# Patient Record
Sex: Female | Born: 1970
Health system: Southern US, Community
[De-identification: ages and names within clinical notes are randomized; demographics above are authoritative.]

## PROBLEM LIST (undated history)

## (undated) DIAGNOSIS — T7840XA Allergy, unspecified, initial encounter: Secondary | ICD-10-CM

## (undated) HISTORY — DX: Allergy, unspecified, initial encounter: T78.40XA

---

## 2006-04-01 DIAGNOSIS — T7840XA Allergy, unspecified, initial encounter: Secondary | ICD-10-CM

## 2006-04-01 HISTORY — DX: Allergy, unspecified, initial encounter: T78.40XA

## 2015-05-22 ENCOUNTER — Encounter: Payer: Self-pay | Admitting: Family Medicine

## 2015-06-02 ENCOUNTER — Encounter: Payer: Self-pay | Admitting: Family Medicine

## 2015-06-02 ENCOUNTER — Ambulatory Visit (INDEPENDENT_AMBULATORY_CARE_PROVIDER_SITE_OTHER): Payer: 59 | Admitting: Family Medicine

## 2015-06-02 VITALS — BP 104/62 | HR 58 | Temp 98.5°F | Resp 12 | Ht 63.0 in | Wt 140.0 lb

## 2015-06-02 DIAGNOSIS — Z1239 Encounter for other screening for malignant neoplasm of breast: Secondary | ICD-10-CM | POA: Diagnosis not present

## 2015-06-02 DIAGNOSIS — B351 Tinea unguium: Secondary | ICD-10-CM | POA: Diagnosis not present

## 2015-06-02 DIAGNOSIS — Z0000 Encounter for general adult medical examination without abnormal findings: Secondary | ICD-10-CM

## 2015-06-02 DIAGNOSIS — Z803 Family history of malignant neoplasm of breast: Secondary | ICD-10-CM

## 2015-06-02 DIAGNOSIS — Z124 Encounter for screening for malignant neoplasm of cervix: Secondary | ICD-10-CM

## 2015-06-02 DIAGNOSIS — J302 Other seasonal allergic rhinitis: Secondary | ICD-10-CM | POA: Diagnosis not present

## 2015-06-02 LAB — COMPREHENSIVE METABOLIC PANEL
ALT: 10 U/L (ref 6–29)
AST: 16 U/L (ref 10–30)
Albumin: 4 g/dL (ref 3.6–5.1)
Alkaline Phosphatase: 31 U/L — ABNORMAL LOW (ref 33–115)
BUN: 13 mg/dL (ref 7–25)
CHLORIDE: 104 mmol/L (ref 98–110)
CO2: 25 mmol/L (ref 20–31)
CREATININE: 0.65 mg/dL (ref 0.50–1.10)
Calcium: 8.7 mg/dL (ref 8.6–10.2)
GLUCOSE: 89 mg/dL (ref 70–99)
POTASSIUM: 3.8 mmol/L (ref 3.5–5.3)
SODIUM: 138 mmol/L (ref 135–146)
Total Bilirubin: 0.8 mg/dL (ref 0.2–1.2)
Total Protein: 6.8 g/dL (ref 6.1–8.1)

## 2015-06-02 LAB — CBC WITH DIFFERENTIAL/PLATELET
Basophils Absolute: 0 10*3/uL (ref 0.0–0.1)
Basophils Relative: 0 % (ref 0–1)
EOS ABS: 0.1 10*3/uL (ref 0.0–0.7)
EOS PCT: 1 % (ref 0–5)
HCT: 38.1 % (ref 36.0–46.0)
Hemoglobin: 12.6 g/dL (ref 12.0–15.0)
LYMPHS ABS: 2.1 10*3/uL (ref 0.7–4.0)
Lymphocytes Relative: 35 % (ref 12–46)
MCH: 32 pg (ref 26.0–34.0)
MCHC: 33.1 g/dL (ref 30.0–36.0)
MCV: 96.7 fL (ref 78.0–100.0)
MONOS PCT: 6 % (ref 3–12)
MPV: 10.2 fL (ref 8.6–12.4)
Monocytes Absolute: 0.4 10*3/uL (ref 0.1–1.0)
Neutro Abs: 3.5 10*3/uL (ref 1.7–7.7)
Neutrophils Relative %: 58 % (ref 43–77)
PLATELETS: 240 10*3/uL (ref 150–400)
RBC: 3.94 MIL/uL (ref 3.87–5.11)
RDW: 12.9 % (ref 11.5–15.5)
WBC: 6.1 10*3/uL (ref 4.0–10.5)

## 2015-06-02 LAB — LIPID PANEL
CHOL/HDL RATIO: 2.8 ratio (ref <=5.0)
Cholesterol: 141 mg/dL (ref 125–200)
HDL: 51 mg/dL (ref >=46)
LDL Cholesterol: 81 mg/dL (ref <130)
Triglycerides: 45 mg/dL (ref <150)
VLDL: 9 mg/dL (ref <30)

## 2015-06-02 LAB — TSH: TSH: 1.6 m[IU]/L

## 2015-06-02 MED ORDER — LEVONORGESTREL 20 MCG/24HR IU IUD: 1.0000 | INTRAUTERINE_SYSTEM | Freq: Once | INTRAUTERINE | Status: DC

## 2015-06-02 MED ORDER — EFINACONAZOLE 10 % EX SOLN: CUTANEOUS | Status: DC

## 2015-06-02 NOTE — Patient Instructions (Addendum)
Release of records- OB/GYN  [place] [phone] Schedule your mammogram We will call with lab results Try zyrtec or claritin Jublia for your nails I recommend eye visit once a year I recommend dental visit every 6 months Goal is to  Exercise 30 minutes 5 days a week F/U as needed or in 1 year

## 2015-06-02 NOTE — Progress Notes (Signed)
Patient ID: Kim Steele, female   DOB: February 24, 1971, 45 y.o.   MRN: [id number]    Subjective:    Patient ID: Kim Steele, female    DOB: February 24, 1971, 45 y.o.   MRN: [id number]  Patient presents for New Patient CPE  Pt here to establish care. No previous PCP, [place] in [place]. She moved here in November due to her husbands job. History of abnormal PAP in the past, also had HPV but that has cleared. Due for mammogram, her mother had breast cancer and MGM.  No long term medications but she has noticed allergies since she moved here in November. Also concerned about 20lb weight gain over past 4 months, with no change in her diet or her activity level. Denies any fatigue or other illness.   Nail fungus for months, does not want oral medications   History reviewed Has 3 children- 3.5, 14, 16 , she home schools      Review Of Systems:  GEN- denies fatigue, fever, weight loss,weakness, recent illness HEENT- denies eye drainage, change in vision, nasal discharge, CVS- denies chest pain, palpitations RESP- denies SOB, cough, wheeze ABD- denies N/V, change in stools, abd pain GU- denies dysuria, hematuria, dribbling, incontinence MSK- denies joint pain, muscle aches, injury Neuro- denies headache, dizziness, syncope, seizure activity       Objective:    BP 104/62 mmHg  Pulse 58  Temp(Src) 98.5 F (36.9 C) (Oral)  Resp 12  Ht 5\' 3"  (1.6 m)  Wt 140 lb (63.504 kg)  BMI 24.81 kg/m2  LMP 06/01/2015 (Approximate) GEN- NAD, alert and oriented x3 HEENT- PERRL, EOMI, non injected sclera, pink conjunctiva, MMM, oropharynx clear Neck- Supple, no thyromegaly CVS- RRR, no murmur Breast- normal symmetry, no nipple inversion,no nipple drainage, no nodules or lumps felt Nodes- no axillary nodes RESP-CTAB ABD-NABS,soft,NT,ND GU- normal external genitalia, vaginal mucosa pink and moist, cervix visualized no growth, no blood form os, minimal thin clear discharge, no CMT, no ovarian masses,  uterus normal size EXT- No edema Pulses- Radial, DP- 2+        Assessment & Plan:      Problem List Items Addressed This Visit    None    Visit Diagnoses    Routine general medical examination at a health care facility    -  Primary    CPE done, Fasting labs, immunizations UTD, DECLINES flu shot, PAP Smear today, set up for mammogram    Relevant Orders    CBC with Differential/Platelet    Comprehensive metabolic panel    Lipid panel    TSH    Cervical cancer screening        Relevant Orders    PAP, ThinPrep ASCUS Rflx HPV Rflx Type    Breast cancer screening        Relevant Orders    MM DIGITAL SCREENING BILATERAL    Family history of breast cancer in first degree relative        Relevant Orders    MM DIGITAL SCREENING BILATERAL    Onychomycosis        Trial of Jublia topical to nails     Relevant Medications    Efinaconazole (JUBLIA) 10 % SOLN    Seasonal allergies        Trial of OTC anti-histamine, D/C Sudafed so no rebound congestion       Note: This dictation was prepared with Dragon dictation along with smaller phrase technology. Any transcriptional errors that result from this  process are unintentional.

## 2015-06-05 LAB — PAP THINPREP ASCUS RFLX HPV RFLX TYPE

## 2015-06-23 ENCOUNTER — Ambulatory Visit
Admission: RE | Admit: 2015-06-23 | Discharge: 2015-06-23 | Disposition: A | Discharge status: Home / Self Care | Payer: 59 | Source: Ambulatory Visit | Attending: Family Medicine | Admitting: Family Medicine

## 2015-06-23 DIAGNOSIS — Z1239 Encounter for other screening for malignant neoplasm of breast: Secondary | ICD-10-CM

## 2015-06-23 DIAGNOSIS — Z803 Family history of malignant neoplasm of breast: Secondary | ICD-10-CM

## 2015-09-04 ENCOUNTER — Encounter: Payer: Self-pay | Admitting: Family Medicine

## 2015-09-04 ENCOUNTER — Ambulatory Visit (INDEPENDENT_AMBULATORY_CARE_PROVIDER_SITE_OTHER): Payer: 59 | Admitting: Family Medicine

## 2015-09-04 VITALS — BP 116/60 | HR 64 | Temp 98.2°F | Resp 14 | Ht 63.0 in | Wt 138.0 lb

## 2015-09-04 DIAGNOSIS — M2550 Pain in unspecified joint: Secondary | ICD-10-CM

## 2015-09-04 LAB — RHEUMATOID FACTOR: Rhuematoid fact SerPl-aCnc: <10 IU/mL (ref <=14)

## 2015-09-04 LAB — C-REACTIVE PROTEIN

## 2015-09-04 NOTE — Progress Notes (Signed)
Patient ID: Kim Steele, female   DOB: 1971-03-26, 45 y.o.   MRN: [id number]    Subjective:    Patient ID: Kim Steele, female    DOB: 1971-03-26, 45 y.o.   MRN: [id number]  Patient presents for Joint/ Bone Pain Patient here with joint pain. The past 4-6 weeks she's had fleeting joint pain initially started in her shoulders then she had to sit in her elbow a couple weeks ago her right knee started hurting when he felt like his on give out when she was walking she's had some ankle pain as well. She's never had any joint pains before. She does remember having a tick bite but this is back in November she actually describes a bull's-eye rash but never received any treatment for this. She would like to have Lyme antibodies done. She denies any fever no rash no chest pain shortness of breath or GI symptoms. She has been taking Tylenol ibuprofen. She is still able to exercise she is up to running 2-1/2 miles 3 times a week and walks the other days. No family history of any rheumatoid arthritis besides a great grandmother    Review Of Systems:  GEN- denies fatigue, fever, weight loss,weakness, recent illness HEENT- denies eye drainage, change in vision, nasal discharge, CVS- denies chest pain, palpitations RESP- denies SOB, cough, wheeze ABD- denies N/V, change in stools, abd pain GU- denies dysuria, hematuria, dribbling, incontinence MSK- + joint pain, muscle aches, injury Neuro- denies headache, dizziness, syncope, seizure activity       Objective:    BP 116/60 mmHg  Pulse 64  Temp(Src) 98.2 F (36.8 C) (Oral)  Resp 14  Ht 5\' 3"  (1.6 m)  Wt 138 lb (62.596 kg)  BMI 24.45 kg/m2 GEN- NAD, alert and oriented x3 HEENT- PERRL, EOMI, non injected sclera, pink conjunctiva, MMM, oropharynx clear Neck- Supple, FROM  CVS- RRR, no murmur RESP-CTAB MSK- Spine NT, neg SLR, FROM spine, FROM bilat knees, HIP, mild pain with IR left hip, FROM ankles, mild TTP Left medial epicondyle against resistance,  muscles UE and LE non tender, rotator cuff in tact, biceps in tact  EXT- No edema Pulses- Radial, DP- 2+        Assessment & Plan:      Problem List Items Addressed This Visit    None    Visit Diagnoses    Joint pain    -  Primary    Polyrheumatica syndrome going on, would be very latent Lymes if titers positive. Check other inflammatory markers,. discussed prednisone she prefers to hold off    Relevant Orders    Sedimentation rate    C-reactive protein    ANA    B. burgdorfi antibodies    Rheumatoid factor       Note: This dictation was prepared with Dragon dictation along with smaller phrase technology. Any transcriptional errors that result from this process are unintentional.

## 2015-09-04 NOTE — Patient Instructions (Signed)
We will call with results F/U pending results  

## 2015-09-05 LAB — ANA: Anti Nuclear Antibody(ANA): NEGATIVE

## 2015-09-05 LAB — SEDIMENTATION RATE: Sed Rate: 0 mm/hr (ref 0–20)

## 2015-09-05 LAB — LYME AB/WESTERN BLOT REFLEX: B burgdorferi Ab IgG+IgM: <0.9 Index (ref <0.90)

## 2017-01-07 ENCOUNTER — Encounter: Payer: Self-pay | Admitting: Family Medicine

## 2017-01-07 ENCOUNTER — Ambulatory Visit (INDEPENDENT_AMBULATORY_CARE_PROVIDER_SITE_OTHER): Payer: BLUE CROSS/BLUE SHIELD | Admitting: Family Medicine

## 2017-01-07 VITALS — BP 100/58 | HR 76 | Temp 98.9°F | Resp 12 | Ht 63.0 in | Wt 137.0 lb

## 2017-01-07 DIAGNOSIS — Z803 Family history of malignant neoplasm of breast: Secondary | ICD-10-CM

## 2017-01-07 DIAGNOSIS — Z0000 Encounter for general adult medical examination without abnormal findings: Secondary | ICD-10-CM

## 2017-01-07 DIAGNOSIS — Z1239 Encounter for other screening for malignant neoplasm of breast: Secondary | ICD-10-CM

## 2017-01-07 DIAGNOSIS — Z1231 Encounter for screening mammogram for malignant neoplasm of breast: Secondary | ICD-10-CM | POA: Diagnosis not present

## 2017-01-07 NOTE — Progress Notes (Signed)
   Subjective:    Patient ID: Kim Steele, female    DOB: 11-Sep-1970, 46 y.o.   MRN: [id number]  Patient presents for Annual Exam (Pt fasting) Issue here for complete physical exam she has no particular concerns today. Therapy see with her 3 children states that she does get tired and fatigued during the day but is sleeping well eating well and trying to do some exercise release a few days a week. She still taking her supplements. She did remove her Mirena IUD she thought maybe this is contributing to her fatigue. There was no change when she took it out. Her husband and she decided that they would just remain off of any birth control    Review Of Systems:  GEN- denies fatigue, fever, weight loss,weakness, recent illness HEENT- denies eye drainage, change in vision, nasal discharge, CVS- denies chest pain, palpitations RESP- denies SOB, cough, wheeze ABD- denies N/V, change in stools, abd pain GU- denies dysuria, hematuria, dribbling, incontinence MSK- denies joint pain, muscle aches, injury Neuro- denies headache, dizziness, syncope, seizure activity       Objective:    BP (!) 100/58   Pulse 76   Temp 98.9 F (37.2 C) (Oral)   Resp 12   Ht  (1.6 m)   Wt 137 lb (62.1 kg)   SpO2 98%   BMI 24.27 kg/m  GEN- NAD, alert and oriented x3 HEENT- PERRL, EOMI, non injected sclera, pink conjunctiva, MMM, oropharynx clear, TM clear bilat no effusion Neck- Supple, no thyromegaly CVS- RRR, no murmur RESP-CTAB ABD-NABS,soft,NT,ND EXT- No edema Pulses- Radial, DP- 2+        Assessment & Plan:      Problem List Items Addressed This Visit    None    Visit Diagnoses    Routine general medical examination at a health care facility    -  Primary   CPE done, pt to schedule mammogram, fasting labs, declines flu shot   Relevant Orders   CBC with Differential/Platelet   Comprehensive metabolic panel   Lipid panel   Vitamin D, 25-hydroxy   TSH   Breast cancer screening       1st degree relative needs screening yearly   Relevant Orders   MM SCREENING BREAST TOMO BILATERAL   Family history of breast cancer       Relevant Orders   MM SCREENING BREAST TOMO BILATERAL      Note: This dictation was prepared with Dragon dictation along with smaller phrase technology. Any transcriptional errors that result from this process are unintentional.

## 2017-01-07 NOTE — Patient Instructions (Signed)
Schedule mammogram I recommend eye visit once a year I recommend dental visit every 6 months Goal is to  Exercise 30 minutes 5 days a week We will send a letter with lab results  F/U 1 year for physical

## 2017-01-08 LAB — COMPREHENSIVE METABOLIC PANEL WITH GFR
AG Ratio: 1.2 (calc) (ref 1.0–2.5)
ALT: 7 U/L (ref 6–29)
AST: 14 U/L (ref 10–35)
Albumin: 4 g/dL (ref 3.6–5.1)
Alkaline phosphatase (APISO): 34 U/L (ref 33–115)
BUN: 14 mg/dL (ref 7–25)
CO2: 24 mmol/L (ref 20–32)
Calcium: 9.2 mg/dL (ref 8.6–10.2)
Chloride: 105 mmol/L (ref 98–110)
Creat: 0.67 mg/dL (ref 0.50–1.10)
Globulin: 3.3 g/dL (ref 1.9–3.7)
Glucose, Bld: 84 mg/dL (ref 65–99)
Potassium: 4 mmol/L (ref 3.5–5.3)
Sodium: 137 mmol/L (ref 135–146)
Total Bilirubin: 0.8 mg/dL (ref 0.2–1.2)
Total Protein: 7.3 g/dL (ref 6.1–8.1)

## 2017-01-08 LAB — CBC WITH DIFFERENTIAL/PLATELET
Basophils Absolute: 37 cells/uL (ref 0–200)
Basophils Relative: 0.5 %
EOS ABS: 110 {cells}/uL (ref 15–500)
Eosinophils Relative: 1.5 %
HCT: 39.1 % (ref 35.0–45.0)
Hemoglobin: 13.2 g/dL (ref 11.7–15.5)
Lymphs Abs: 2197 cells/uL (ref 850–3900)
MCH: 32 pg (ref 27.0–33.0)
MCHC: 33.8 g/dL (ref 32.0–36.0)
MCV: 94.7 fL (ref 80.0–100.0)
MONOS PCT: 5.6 %
MPV: 10.7 fL (ref 7.5–12.5)
NEUTROS PCT: 62.3 %
Neutro Abs: 4548 cells/uL (ref 1500–7800)
PLATELETS: 248 10*3/uL (ref 140–400)
RBC: 4.13 10*6/uL (ref 3.80–5.10)
RDW: 11.7 % (ref 11.0–15.0)
TOTAL LYMPHOCYTE: 30.1 %
WBC: 7.3 10*3/uL (ref 3.8–10.8)
WBCMIX: 409 {cells}/uL (ref 200–950)

## 2017-01-08 LAB — VITAMIN D 25 HYDROXY (VIT D DEFICIENCY, FRACTURES): Vit D, 25-Hydroxy: 30 ng/mL (ref 30–100)

## 2017-01-08 LAB — LIPID PANEL
Cholesterol: 162 mg/dL
HDL: 52 mg/dL
LDL Cholesterol (Calc): 96 mg/dL
Non-HDL Cholesterol (Calc): 110 mg/dL
Total CHOL/HDL Ratio: 3.1 (calc)
Triglycerides: 55 mg/dL

## 2017-01-08 LAB — TSH: TSH: 1.34 mIU/L

## 2017-01-09 ENCOUNTER — Encounter: Payer: Self-pay | Admitting: Family Medicine

## 2017-01-24 ENCOUNTER — Ambulatory Visit
Admission: RE | Admit: 2017-01-24 | Discharge: 2017-01-24 | Disposition: A | Discharge status: Home / Self Care | Payer: BLUE CROSS/BLUE SHIELD | Source: Ambulatory Visit | Attending: Family Medicine | Admitting: Family Medicine

## 2017-01-24 DIAGNOSIS — Z803 Family history of malignant neoplasm of breast: Secondary | ICD-10-CM

## 2017-01-24 DIAGNOSIS — Z1231 Encounter for screening mammogram for malignant neoplasm of breast: Secondary | ICD-10-CM | POA: Diagnosis not present

## 2017-01-24 DIAGNOSIS — Z1239 Encounter for other screening for malignant neoplasm of breast: Secondary | ICD-10-CM

## 2017-07-10 ENCOUNTER — Other Ambulatory Visit: Payer: Self-pay

## 2017-07-10 ENCOUNTER — Ambulatory Visit (INDEPENDENT_AMBULATORY_CARE_PROVIDER_SITE_OTHER): Payer: BLUE CROSS/BLUE SHIELD | Admitting: Physician Assistant

## 2017-07-10 ENCOUNTER — Encounter: Payer: Self-pay | Admitting: Physician Assistant

## 2017-07-10 VITALS — BP 98/60 | HR 68 | Temp 98.2°F | Resp 16 | Ht 62.75 in | Wt 143.4 lb

## 2017-07-10 DIAGNOSIS — H6122 Impacted cerumen, left ear: Secondary | ICD-10-CM

## 2017-07-10 NOTE — Progress Notes (Signed)
    Patient ID: Kim Steele MRN: [id number], DOB: 10-14-1970, 47 y.o. Date of Encounter: 07/10/2017, 4:28 PM    Chief Complaint:  Chief Complaint  Patient presents with  . left ear pain    symptoms since 05/09/2017     HPI: 47 y.o. year old female presents with above.  She reports [facility] [place]  Water got stuck in her left ear for a whole day there.  It seemed to come out while she was sleeping.  Says that off and on ever since then-- her left ear has hurt occasionally and she has felt some discomfort there off and on ever since.  No other concerns to address.   Home Meds:   Outpatient Medications Prior to Visit  Medication Sig Dispense Refill  . B Complex-C (SUPER B COMPLEX PO) Take 1 capsule by mouth daily.    . Calcium Carbonate-Vitamin D (CALCIUM 600+D) 600-400 MG-UNIT tablet Take 1 tablet by mouth daily.    . vitamin C (ASCORBIC ACID) 500 MG tablet Take 1,000 mg by mouth daily.    . Cholecalciferol (VITAMIN D3) 1000 units CAPS Take 1 capsule by mouth daily.    . Multiple Vitamin (MULTIVITAMIN) tablet Take 1 tablet by mouth daily.     No facility-administered medications prior to visit.     Allergies: No Known Allergies    Review of Systems: See HPI for pertinent ROS. All other ROS negative.    Physical Exam: Blood pressure 98/60, pulse 68, temperature 98.2 F (36.8 C), temperature source Oral, resp. rate 16, height 5' 2.75" (1.594 m), weight 65 kg (143 lb 6.4 oz), last menstrual period 07/09/2017, SpO2 98 %., Body mass index is 25.6 kg/m. General:  WNWD WF. Appears in no acute distress. HEENT: Normocephalic, atraumatic, eyes without discharge, sclera non-icteric, nares are without discharge.  Right ear canal appears normal.  Right tympanic membrane appears normal. Left ear canal is completely occluded with cerumen. Neck: Supple. No thyromegaly. No lymphadenopathy. Lungs: Clear bilaterally to auscultation without wheezes, rales, or  rhonchi. Breathing is unlabored. Heart: Regular rhythm. No murmurs, rubs, or gallops. Msk:  Strength and tone normal for age. Extremities/Skin: Warm and dry. Neuro: Alert and oriented X 3. Moves all extremities spontaneously. Gait is normal. CNII-XII grossly in tact. Psych:  Responds to questions appropriately with a normal affect.     ASSESSMENT AND PLAN:  47 y.o. year old female with    1. Impacted cerumen of left ear I discussed exam findings and that left ear has cerumen impaction.  Feel that this is causing her symptoms.  Recommended that we irrigate ear here but she does not want to have irrigation and wants to use drops at home.  She is to use drops to remove cerumen.  Follow-up if symptoms do not resolve after this treatment.   Murray HodgkinsSigned, Journii Nierman Beth [place], [place], [facility] 07/10/2017 4:28 PM

## 2018-02-12 ENCOUNTER — Other Ambulatory Visit: Payer: Self-pay | Admitting: Family Medicine

## 2018-02-12 DIAGNOSIS — Z1231 Encounter for screening mammogram for malignant neoplasm of breast: Secondary | ICD-10-CM

## 2018-03-31 ENCOUNTER — Ambulatory Visit
Admission: RE | Admit: 2018-03-31 | Discharge: 2018-03-31 | Disposition: A | Discharge status: Home / Self Care | Payer: BLUE CROSS/BLUE SHIELD | Source: Ambulatory Visit | Attending: Family Medicine | Admitting: Family Medicine

## 2018-03-31 DIAGNOSIS — Z1231 Encounter for screening mammogram for malignant neoplasm of breast: Secondary | ICD-10-CM

## 2019-03-15 ENCOUNTER — Other Ambulatory Visit: Payer: Self-pay

## 2019-03-15 DIAGNOSIS — Z20822 Contact with and (suspected) exposure to covid-19: Secondary | ICD-10-CM

## 2019-03-16 LAB — NOVEL CORONAVIRUS, NAA: SARS-CoV-2, NAA: NOT DETECTED

## 2019-04-07 DIAGNOSIS — Z20828 Contact with and (suspected) exposure to other viral communicable diseases: Secondary | ICD-10-CM | POA: Diagnosis not present

## 2019-09-27 ENCOUNTER — Other Ambulatory Visit: Payer: Self-pay

## 2019-09-27 ENCOUNTER — Encounter: Payer: Self-pay | Admitting: Nurse Practitioner

## 2019-09-27 ENCOUNTER — Ambulatory Visit (INDEPENDENT_AMBULATORY_CARE_PROVIDER_SITE_OTHER): Payer: BC Managed Care – PPO | Admitting: Nurse Practitioner

## 2019-09-27 VITALS — BP 118/80 | HR 69 | Temp 98.1°F | Resp 18 | Ht 62.0 in | Wt 150.4 lb

## 2019-09-27 DIAGNOSIS — Z0000 Encounter for general adult medical examination without abnormal findings: Secondary | ICD-10-CM

## 2019-09-27 DIAGNOSIS — Z1211 Encounter for screening for malignant neoplasm of colon: Secondary | ICD-10-CM

## 2019-09-27 DIAGNOSIS — Z13220 Encounter for screening for lipoid disorders: Secondary | ICD-10-CM

## 2019-09-27 DIAGNOSIS — Z1159 Encounter for screening for other viral diseases: Secondary | ICD-10-CM

## 2019-09-27 DIAGNOSIS — Z1231 Encounter for screening mammogram for malignant neoplasm of breast: Secondary | ICD-10-CM

## 2019-09-27 DIAGNOSIS — Z13228 Encounter for screening for other metabolic disorders: Secondary | ICD-10-CM | POA: Diagnosis not present

## 2019-09-27 DIAGNOSIS — Z1212 Encounter for screening for malignant neoplasm of rectum: Secondary | ICD-10-CM

## 2019-09-27 DIAGNOSIS — Z124 Encounter for screening for malignant neoplasm of cervix: Secondary | ICD-10-CM

## 2019-09-27 NOTE — Patient Instructions (Addendum)
Drink plenty of water  Follow 2 G sodium per day diet  Blood pressure goal 140/90 or less seek medical attention for out of range  Follow healthy diet rich in fiber, low carb, sugar, cholesterol, fats  Labs completed today including HepC screen  Schedule your mammogram, PAP completed today Completed yearly  Complete Cologuard once sent to your home

## 2019-09-27 NOTE — Progress Notes (Signed)
Established Patient Office Visit  Subjective:  Patient ID: Kim Steele, female    DOB: 07-27-1970  Age: 49 y.o. MRN: [id number]  CC:  Chief Complaint  Patient presents with  . Annual Exam    HPI Kim Steele is a 49 year old female presenting for general adult yearly examination and desiring her pap smear. She denied cp, ct, gu, gi sxs, pain, sob, edema, palpitation, or falls. She desires Cologuard for colorectal screen, will schedule mammogram, labs today with hep c screen, utd on tdap. Completed COVID vaccine 05/21, 06/21.  She reports getting at least 20 minutes of exercise 4 times per week.   Past Medical History:  Diagnosis Date  . Allergy 2008   seasonal    No past surgical history on file.  Family History  Problem Relation Age of Onset  . Hyperlipidemia Mother   . Cancer Mother        Breast Cancer -2012  . Breast cancer Mother 1  . Alcohol abuse Father   . Arthritis Maternal Grandmother   . Birth defects Maternal Grandmother        cleft palate  . COPD Maternal Grandmother   . Hearing loss Maternal Grandmother   . Cancer Maternal Grandmother        Bresat Cancer- early stage  . Breast cancer Maternal Grandmother   . Heart disease Paternal Aunt     Social History   Socioeconomic History  . Marital status: Married    Spouse name: Not on file  . Number of children: Not on file  . Years of education: Not on file  . Highest education level: Not on file  Occupational History  . Not on file  Tobacco Use  . Smoking status: Never Smoker  . Smokeless tobacco: Never Used  Substance and Sexual Activity  . Alcohol use: No    Alcohol/week: 0.0 standard drinks  . Drug use: No  . Sexual activity: Yes    Birth control/protection: I.U.D.  Other Topics Concern  . Not on file  Social History Narrative  . Not on file   Social Determinants of Health   Financial Resource Strain:   . Difficulty of Paying Living Expenses:   Food Insecurity:   .  Worried About Charity fundraiser in the Last Year:   . Arboriculturist in the Last Year:   Transportation Needs:   . Film/video editor (Medical):   Marland Kitchen Lack of Transportation (Non-Medical):   Physical Activity: Insufficiently Active  . Days of Exercise per Week: 3 days  . Minutes of Exercise per Session: 30 min  Stress:   . Feeling of Stress :   Social Connections:   . Frequency of Communication with Friends and Family:   . Frequency of Social Gatherings with Friends and Family:   . Attends Religious Services:   . Active Member of Clubs or Organizations:   . Attends Archivist Meetings:   Marland Kitchen Marital Status:   Intimate Partner Violence:   . Fear of Current or Ex-Partner:   . Emotionally Abused:   Marland Kitchen Physically Abused:   . Sexually Abused:     Outpatient Medications Prior to Visit  Medication Sig Dispense Refill  . B Complex-C (SUPER B COMPLEX PO) Take 1 capsule by mouth daily.    . Calcium Carbonate-Vitamin D (CALCIUM 600+D) 600-400 MG-UNIT tablet Take 1 tablet by mouth daily.    . Cholecalciferol (VITAMIN D3) 1000 units CAPS Take 1 capsule by  mouth daily.    . Multiple Vitamin (MULTIVITAMIN) tablet Take 1 tablet by mouth daily.    . vitamin C (ASCORBIC ACID) 500 MG tablet Take 1,000 mg by mouth daily.     No facility-administered medications prior to visit.    No Known Allergies  ROS Review of Systems  All other systems reviewed and are negative.     Objective:    Physical Exam Vitals and nursing note reviewed.  Constitutional:      Appearance: Normal appearance. She is well-developed and well-groomed. She is not ill-appearing.  HENT:     Head: Normocephalic.     Right Ear: Hearing and external ear normal.     Left Ear: Hearing and external ear normal.     Nose: Nose normal.     Mouth/Throat:     Lips: Pink.  Eyes:     General: Lids are normal. Lids are everted, no foreign bodies appreciated.     Extraocular Movements: Extraocular movements  intact.     Conjunctiva/sclera: Conjunctivae normal.     Pupils: Pupils are equal, round, and reactive to light.  Neck:     Thyroid: No thyromegaly or thyroid tenderness.     Vascular: No carotid bruit or JVD.  Cardiovascular:     Rate and Rhythm: Normal rate and regular rhythm.     Pulses: Normal pulses.     Heart sounds: Normal heart sounds, S1 normal and S2 normal.  Pulmonary:     Effort: Pulmonary effort is normal.     Breath sounds: Normal breath sounds.  Abdominal:     General: Abdomen is flat. Bowel sounds are normal. There is no abdominal bruit.     Palpations: Abdomen is soft. There is no hepatomegaly or splenomegaly.     Tenderness: There is no abdominal tenderness. There is no right CVA tenderness, left CVA tenderness, guarding or rebound.  Musculoskeletal:        General: Normal range of motion.     Cervical back: Full passive range of motion without pain, normal range of motion and neck supple.     Right lower leg: No edema.     Left lower leg: No edema.  Lymphadenopathy:     Head:     Right side of head: No submental, submandibular or tonsillar adenopathy.     Left side of head: No submental, submandibular or tonsillar adenopathy.     Cervical: No cervical adenopathy.  Skin:    General: Skin is warm and dry.     Capillary Refill: Capillary refill takes less than 2 seconds.     Coloration: Skin is not jaundiced.     Findings: No bruising or erythema.  Neurological:     General: No focal deficit present.     Mental Status: She is alert and oriented to person, place, and time.  Psychiatric:        Attention and Perception: Attention and perception normal.        Mood and Affect: Mood normal.        Speech: Speech normal.        Behavior: Behavior normal. Behavior is cooperative.        Thought Content: Thought content normal.        Cognition and Memory: Cognition normal.        Judgment: Judgment normal.     BP 118/80 (BP Location: Left Arm, Patient Position:  Sitting, Cuff Size: Normal)   Pulse 69   Temp 98.1 F (36.7 C) (  Temporal)   Resp 18   Ht 5\' 2"  (1.575 m)   Wt 150 lb 6.4 oz (68.2 kg)   LMP 09/06/2019   SpO2 98%   BMI 27.51 kg/m  Wt Readings from Last 3 Encounters:  09/27/19 150 lb 6.4 oz (68.2 kg)  07/10/17 143 lb 6.4 oz (65 kg)  01/07/17 137 lb (62.1 kg)     Health Maintenance Due  Topic Date Due  . Hepatitis C Screening  Never done  . PAP SMEAR-Modifier  06/02/2018    There are no preventive care reminders to display for this patient.  Lab Results  Component Value Date   TSH 1.34 01/07/2017   Lab Results  Component Value Date   WBC 7.3 01/07/2017   HGB 13.2 01/07/2017   HCT 39.1 01/07/2017   MCV 94.7 01/07/2017   PLT 248 01/07/2017   Lab Results  Component Value Date   NA 137 01/07/2017   K 4.0 01/07/2017   CO2 24 01/07/2017   GLUCOSE 84 01/07/2017   BUN 14 01/07/2017   CREATININE 0.67 01/07/2017   BILITOT 0.8 01/07/2017   ALKPHOS 31 (L) 06/02/2015   AST 14 01/07/2017   ALT 7 01/07/2017   PROT 7.3 01/07/2017   ALBUMIN 4.0 06/02/2015   CALCIUM 9.2 01/07/2017   Lab Results  Component Value Date   CHOL 162 01/07/2017   Lab Results  Component Value Date   HDL 52 01/07/2017   Lab Results  Component Value Date   LDLCALC 96 01/07/2017   Lab Results  Component Value Date   TRIG 55 01/07/2017   Lab Results  Component Value Date   CHOLHDL 3.1 01/07/2017   No results found for: HGBA1C    Assessment & Plan:   Problem List Items Addressed This Visit    None    Visit Diagnoses    Adult general medical exam    -  Primary   Cervical cancer screening       Relevant Orders   PAP, Thin Prep w/HPV rflx HPV Type 16/18   Encounter for screening mammogram for malignant neoplasm of breast       Relevant Orders   MM Digital Screening   Screening, lipid       Relevant Orders   Lipid panel   Screening for metabolic disorder       Relevant Orders   CBC with Differential/Platelet   COMPLETE  METABOLIC PANEL WITH GFR   Need for hepatitis C screening test       Relevant Orders   Hepatitis C antibody    Drink plenty of water  Follow 2 G sodium per day diet  Blood pressure goal 140/90 or less seek medical attention for out of range  Follow healthy diet rich in fiber, low carb, sugar, cholesterol, fats  Labs completed today including HepC screen  Schedule your mammogram, PAP completed today Completed yearly  Complete Cologuard once sent to your home  No orders of the defined types were placed in this encounter.   Follow-up: Return in about 1 year (around 09/26/2020).    09/28/2020, FNP

## 2019-09-28 LAB — COMPLETE METABOLIC PANEL WITH GFR
AG Ratio: 1.3 (calc) (ref 1.0–2.5)
ALT: 9 U/L (ref 6–29)
AST: 12 U/L (ref 10–35)
Albumin: 3.9 g/dL (ref 3.6–5.1)
Alkaline phosphatase (APISO): 40 U/L (ref 31–125)
BUN: 8 mg/dL (ref 7–25)
CO2: 21 mmol/L (ref 20–32)
Calcium: 8.5 mg/dL — ABNORMAL LOW (ref 8.6–10.2)
Chloride: 107 mmol/L (ref 98–110)
Creat: 0.69 mg/dL (ref 0.50–1.10)
GFR, Est African American: 119 mL/min/{1.73_m2} (ref >=60)
GFR, Est Non African American: 103 mL/min/{1.73_m2} (ref >=60)
Globulin: 3.1 g/dL (calc) (ref 1.9–3.7)
Glucose, Bld: 96 mg/dL (ref 65–99)
Potassium: 4.2 mmol/L (ref 3.5–5.3)
Sodium: 138 mmol/L (ref 135–146)
Total Bilirubin: 0.2 mg/dL (ref 0.2–1.2)
Total Protein: 7 g/dL (ref 6.1–8.1)

## 2019-09-28 LAB — LIPID PANEL
Cholesterol: 159 mg/dL (ref <200)
HDL: 48 mg/dL — ABNORMAL LOW (ref >=50)
LDL Cholesterol (Calc): 101 mg/dL (calc) — ABNORMAL HIGH
Non-HDL Cholesterol (Calc): 111 mg/dL (calc) (ref <130)
Total CHOL/HDL Ratio: 3.3 (calc) (ref <5.0)
Triglycerides: 33 mg/dL (ref <150)

## 2019-09-28 LAB — CBC WITH DIFFERENTIAL/PLATELET
Absolute Monocytes: 406 cells/uL (ref 200–950)
Basophils Absolute: 49 cells/uL (ref 0–200)
Basophils Relative: 0.7 %
Eosinophils Absolute: 119 cells/uL (ref 15–500)
Eosinophils Relative: 1.7 %
HCT: 39.8 % (ref 35.0–45.0)
Hemoglobin: 13.2 g/dL (ref 11.7–15.5)
Lymphs Abs: 1456 cells/uL (ref 850–3900)
MCH: 32.4 pg (ref 27.0–33.0)
MCHC: 33.2 g/dL (ref 32.0–36.0)
MCV: 97.8 fL (ref 80.0–100.0)
MPV: 10.1 fL (ref 7.5–12.5)
Monocytes Relative: 5.8 %
Neutro Abs: 4970 cells/uL (ref 1500–7800)
Neutrophils Relative %: 71 %
Platelets: 262 10*3/uL (ref 140–400)
RBC: 4.07 10*6/uL (ref 3.80–5.10)
RDW: 11.5 % (ref 11.0–15.0)
Total Lymphocyte: 20.8 %
WBC: 7 10*3/uL (ref 3.8–10.8)

## 2019-09-28 LAB — PAP, TP IMAGING W/ HPV RNA, RFLX HPV TYPE 16,18/45: HPV DNA High Risk: NOT DETECTED

## 2019-09-28 LAB — HEPATITIS C ANTIBODY
Hepatitis C Ab: NONREACTIVE
SIGNAL TO CUT-OFF: 0.19 (ref <1.00)

## 2019-09-28 NOTE — Progress Notes (Signed)
Labs look ok. Could increase calcium, increase good cholesterol such as in avocados, eggs, extra virgin olive oils, and lower bad cholesterol's.

## 2019-10-01 ENCOUNTER — Encounter: Payer: BLUE CROSS/BLUE SHIELD | Admitting: Family Medicine

## 2019-11-10 DIAGNOSIS — Z20822 Contact with and (suspected) exposure to covid-19: Secondary | ICD-10-CM | POA: Diagnosis not present

## 2019-11-15 ENCOUNTER — Other Ambulatory Visit: Payer: Self-pay | Admitting: Family Medicine

## 2019-11-15 DIAGNOSIS — Z1231 Encounter for screening mammogram for malignant neoplasm of breast: Secondary | ICD-10-CM

## 2019-11-22 DIAGNOSIS — K136 Irritative hyperplasia of oral mucosa: Secondary | ICD-10-CM | POA: Diagnosis not present

## 2019-11-24 ENCOUNTER — Ambulatory Visit
Admission: RE | Admit: 2019-11-24 | Discharge: 2019-11-24 | Disposition: A | Discharge status: Home / Self Care | Payer: BC Managed Care – PPO | Source: Ambulatory Visit

## 2019-11-24 ENCOUNTER — Other Ambulatory Visit: Payer: Self-pay

## 2019-11-24 DIAGNOSIS — Z1231 Encounter for screening mammogram for malignant neoplasm of breast: Secondary | ICD-10-CM

## 2020-03-10 DIAGNOSIS — Z20822 Contact with and (suspected) exposure to covid-19: Secondary | ICD-10-CM | POA: Diagnosis not present

## 2020-09-27 ENCOUNTER — Encounter: Payer: BC Managed Care – PPO | Admitting: Family Medicine

## 2020-10-23 DIAGNOSIS — J309 Allergic rhinitis, unspecified: Secondary | ICD-10-CM | POA: Diagnosis not present

## 2020-10-23 DIAGNOSIS — Z7185 Encounter for immunization safety counseling: Secondary | ICD-10-CM | POA: Diagnosis not present

## 2020-10-31 DIAGNOSIS — Z23 Encounter for immunization: Secondary | ICD-10-CM | POA: Diagnosis not present

## 2020-12-26 DIAGNOSIS — Z0000 Encounter for general adult medical examination without abnormal findings: Secondary | ICD-10-CM | POA: Diagnosis not present

## 2020-12-27 ENCOUNTER — Other Ambulatory Visit: Payer: Self-pay | Admitting: Family Medicine

## 2020-12-27 DIAGNOSIS — Z1231 Encounter for screening mammogram for malignant neoplasm of breast: Secondary | ICD-10-CM

## 2021-01-02 DIAGNOSIS — Z1211 Encounter for screening for malignant neoplasm of colon: Secondary | ICD-10-CM | POA: Diagnosis not present

## 2021-01-02 DIAGNOSIS — Z0000 Encounter for general adult medical examination without abnormal findings: Secondary | ICD-10-CM | POA: Diagnosis not present

## 2021-01-18 ENCOUNTER — Ambulatory Visit
Admission: RE | Admit: 2021-01-18 | Discharge: 2021-01-18 | Disposition: A | Discharge status: Home / Self Care | Payer: BC Managed Care – PPO | Source: Ambulatory Visit

## 2021-01-18 ENCOUNTER — Other Ambulatory Visit: Payer: Self-pay

## 2021-01-18 DIAGNOSIS — Z1231 Encounter for screening mammogram for malignant neoplasm of breast: Secondary | ICD-10-CM

## 2022-04-29 IMAGING — MG MM DIGITAL SCREENING BILAT W/ TOMO AND CAD
8 series · 8 of 24 positions shown · non-contrast
Comparison: Previous exam(s).

CLINICAL DATA: Screening.

EXAM:
DIGITAL SCREENING BILATERAL MAMMOGRAM WITH TOMOSYNTHESIS AND CAD
TECHNIQUE: Bilateral screening digital craniocaudal and mediolateral oblique
mammograms were obtained. Bilateral screening digital breast
tomosynthesis was performed. The images were evaluated with
computer-aided detection.

[L CC synth-2D]
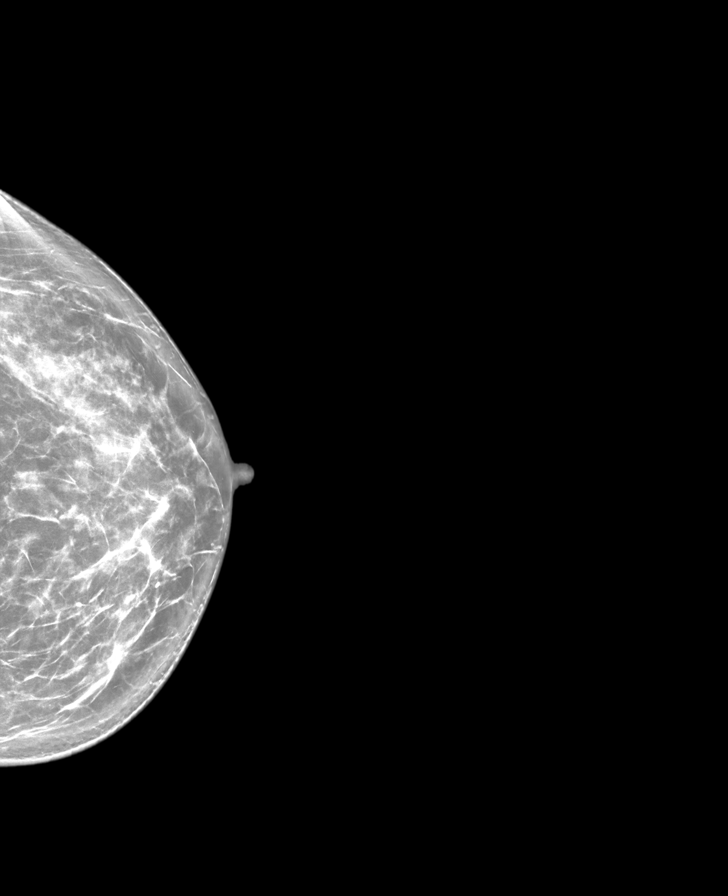

[R MLO synth-2D]
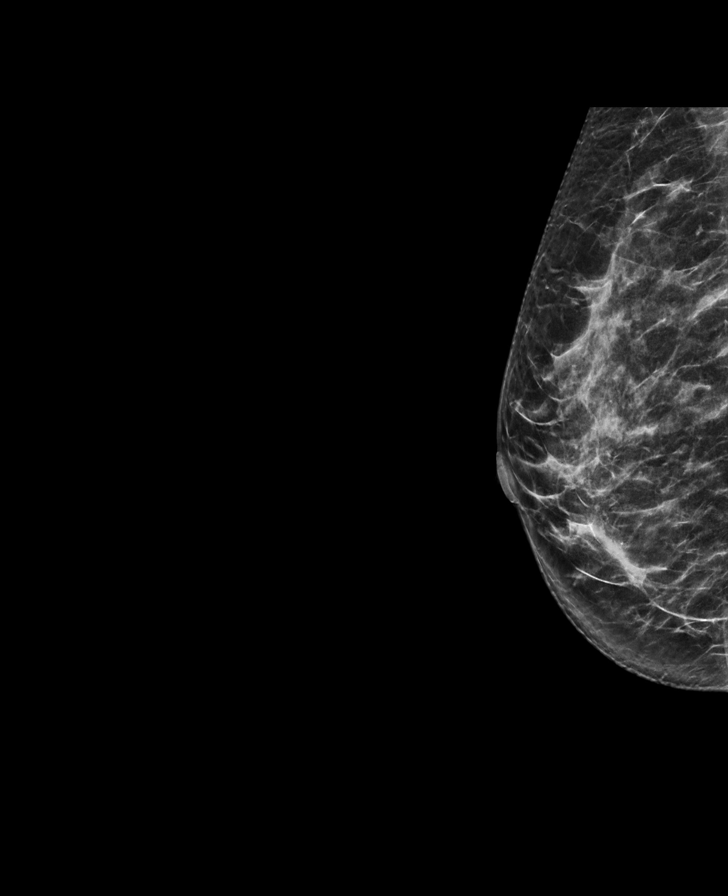

[L MLO synth-2D]
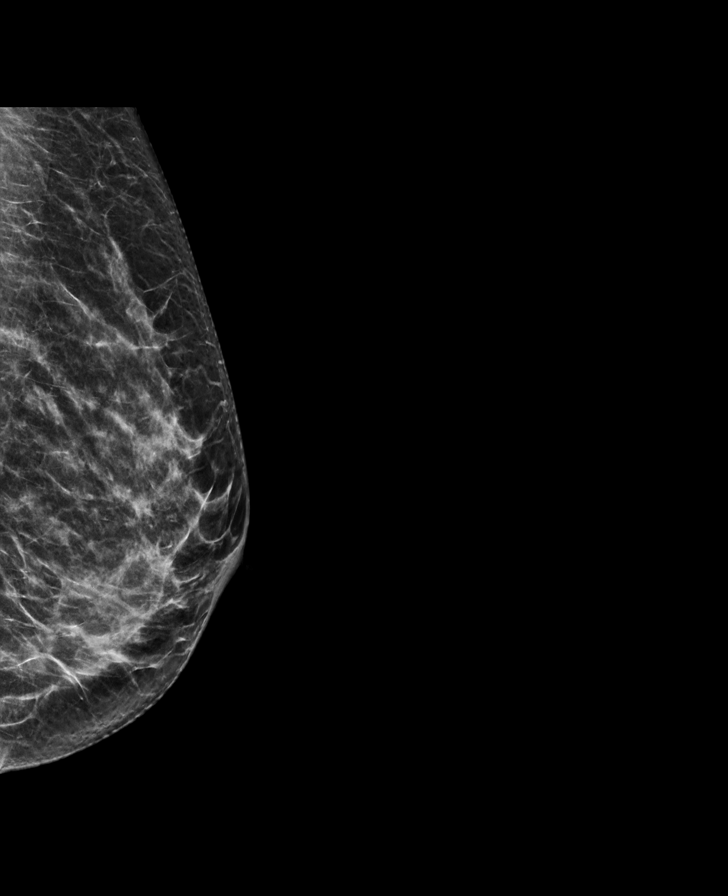

[R CC synth-2D]
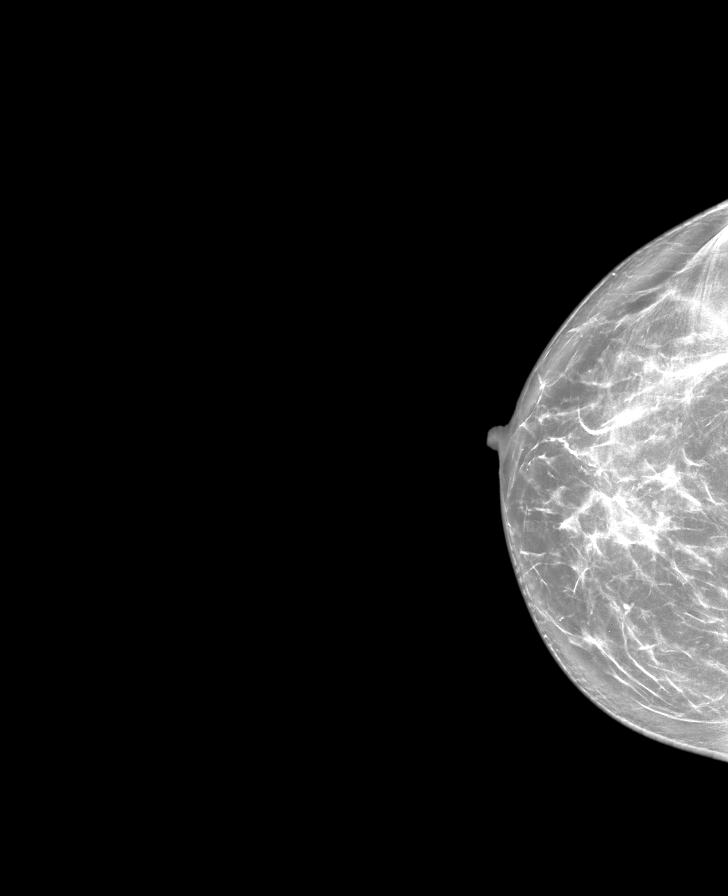

[L CC tomo · tomo slice 40/79.0]
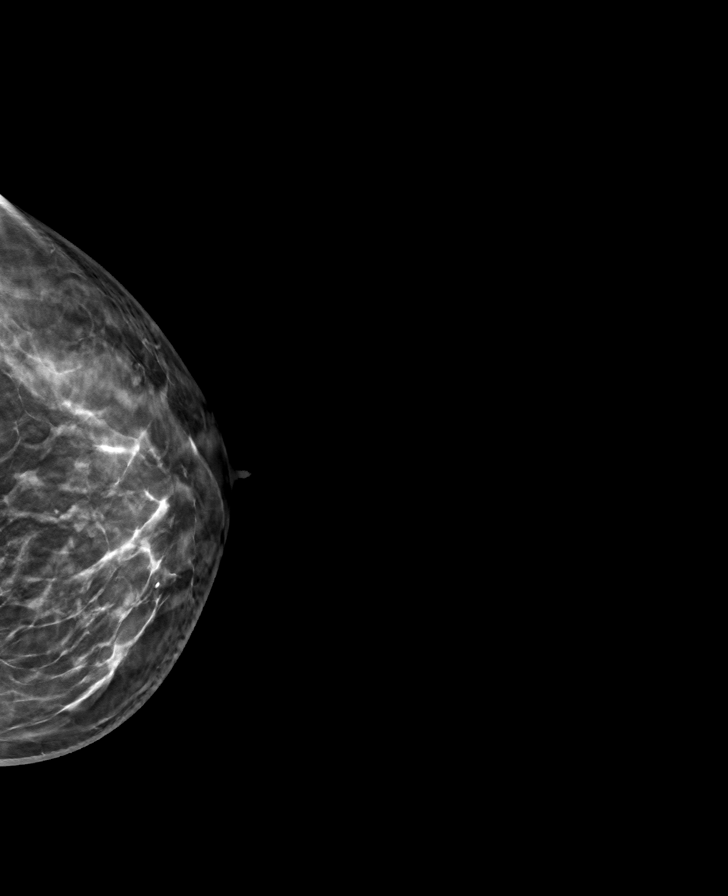

[R MLO tomo · tomo slice 33/65.0]
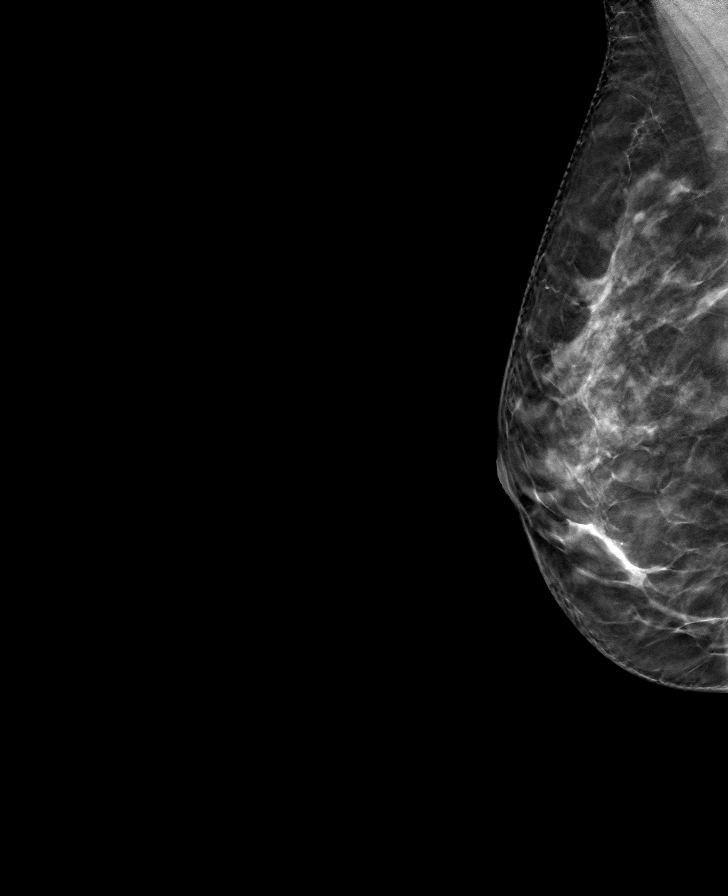

[R CC tomo · tomo slice 41/80.0]
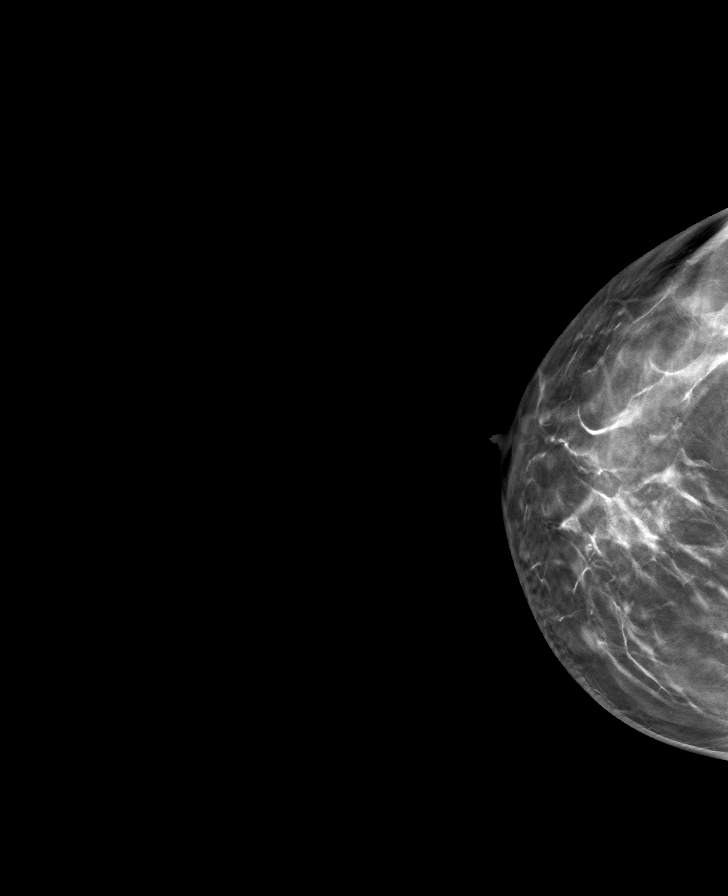

[L MLO tomo · tomo slice 34/67.0]
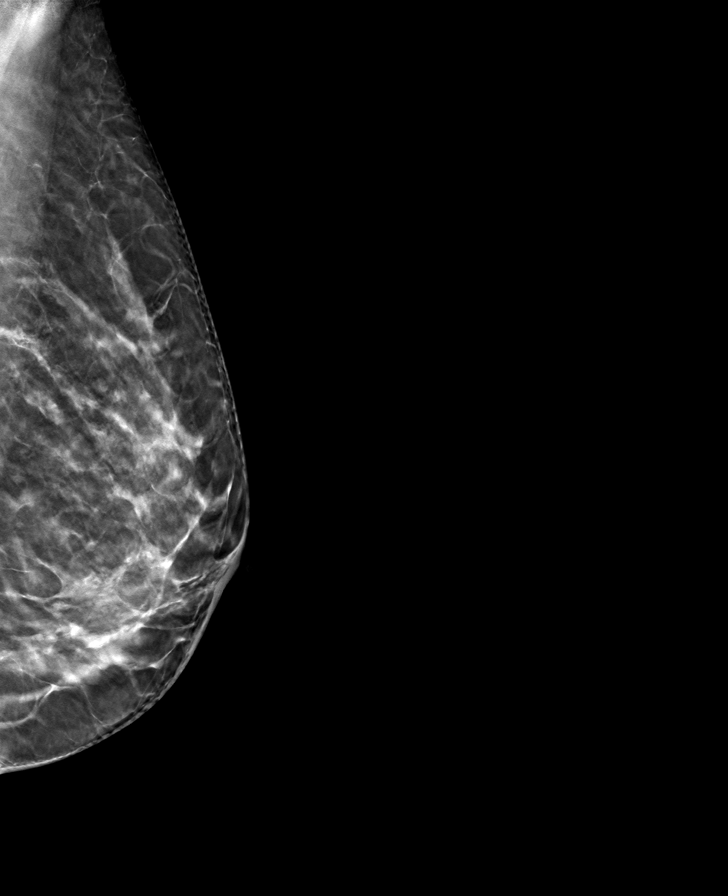

[8 of 24 positions shown; findings below may reference images not displayed]

ACR Breast Density Category c: The breast tissue is heterogeneously
dense, which may obscure small masses.
FINDINGS: There are no findings suspicious for malignancy.
IMPRESSION: No mammographic evidence of malignancy. A result letter of this
screening mammogram will be mailed directly to the patient.

RECOMMENDATION:
Screening mammogram in one year. (Code:Q3-W-BC3)

BI-RADS CATEGORY  1: Negative.
